# Patient Record
Sex: Female | Born: 2000
Health system: Southern US, Community
[De-identification: ages and names within clinical notes are randomized; demographics above are authoritative.]

## PROBLEM LIST (undated history)

## (undated) DIAGNOSIS — J45909 Unspecified asthma, uncomplicated: Secondary | ICD-10-CM

## (undated) HISTORY — PX: MYRINGOTOMY: SHX2060

---
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
## 2021-02-12 DIAGNOSIS — Z23 Encounter for immunization: Secondary | ICD-10-CM | POA: Diagnosis not present

## 2021-02-12 DIAGNOSIS — Z13220 Encounter for screening for lipoid disorders: Secondary | ICD-10-CM | POA: Diagnosis not present

## 2021-02-12 DIAGNOSIS — Z8349 Family history of other endocrine, nutritional and metabolic diseases: Secondary | ICD-10-CM | POA: Diagnosis not present

## 2021-02-12 DIAGNOSIS — Z0000 Encounter for general adult medical examination without abnormal findings: Secondary | ICD-10-CM | POA: Diagnosis not present

## 2021-02-23 ENCOUNTER — Other Ambulatory Visit (HOSPITAL_COMMUNITY): Payer: Self-pay | Admitting: Oral Surgery

## 2021-02-23 MED FILL — ACETAMINOPHEN/COD #3 TABLET: 300-30 | 5 days supply | Qty: 20 | Fill #0

## 2021-02-23 MED FILL — AMOXICILLIN 500 MG CAPSULE: 500 | 7 days supply | Qty: 21 | Fill #0

## 2021-04-24 ENCOUNTER — Other Ambulatory Visit (HOSPITAL_COMMUNITY): Payer: Self-pay

## 2021-04-24 DIAGNOSIS — J309 Allergic rhinitis, unspecified: Secondary | ICD-10-CM | POA: Diagnosis not present

## 2021-04-24 DIAGNOSIS — H6692 Otitis media, unspecified, left ear: Secondary | ICD-10-CM | POA: Diagnosis not present

## 2021-04-24 DIAGNOSIS — H6121 Impacted cerumen, right ear: Secondary | ICD-10-CM | POA: Diagnosis not present

## 2021-04-24 MED ORDER — FLUTICASONE PROPIONATE 50 MCG/ACT NA SUSP
NASAL | 4 refills | Status: AC
  Filled 2021-04-24: qty 48, 90d supply, fill #0

## 2021-04-24 MED ORDER — AMOXICILLIN 875 MG PO TABS
ORAL_TABLET | ORAL | 0 refills | Status: AC
  Filled 2021-04-24: qty 20, 10d supply, fill #0

## 2021-05-14 DIAGNOSIS — L700 Acne vulgaris: Secondary | ICD-10-CM | POA: Diagnosis not present

## 2021-05-14 DIAGNOSIS — D2261 Melanocytic nevi of right upper limb, including shoulder: Secondary | ICD-10-CM | POA: Diagnosis not present

## 2021-05-22 ENCOUNTER — Other Ambulatory Visit (HOSPITAL_COMMUNITY): Payer: Self-pay

## 2021-05-22 MED ORDER — TRETINOIN 0.025 % EX CREA
TOPICAL_CREAM | CUTANEOUS | 4 refills | Status: AC
  Filled 2021-05-22: qty 45, 30d supply, fill #0

## 2021-07-24 DIAGNOSIS — Z91018 Allergy to other foods: Secondary | ICD-10-CM | POA: Diagnosis not present

## 2021-10-15 ENCOUNTER — Other Ambulatory Visit (HOSPITAL_COMMUNITY): Payer: Self-pay

## 2021-10-15 DIAGNOSIS — H1033 Unspecified acute conjunctivitis, bilateral: Secondary | ICD-10-CM | POA: Diagnosis not present

## 2021-10-15 DIAGNOSIS — J209 Acute bronchitis, unspecified: Secondary | ICD-10-CM | POA: Diagnosis not present

## 2021-10-15 MED ORDER — AZITHROMYCIN 250 MG PO TABS
ORAL_TABLET | ORAL | 0 refills | Status: AC
  Filled 2021-10-15: qty 6, 5d supply, fill #0

## 2021-10-15 MED ORDER — BENZONATATE 200 MG PO CAPS
200.0000 mg | ORAL_CAPSULE | Freq: Three times a day (TID) | ORAL | 0 refills | Status: AC | PRN
  Filled 2021-10-15: qty 21, 7d supply, fill #0

## 2021-10-15 MED ORDER — OFLOXACIN 0.3 % OP SOLN
OPHTHALMIC | 0 refills | Status: AC
  Filled 2021-10-15: qty 5, 10d supply, fill #0

## 2022-11-18 DIAGNOSIS — Z3200 Encounter for pregnancy test, result unknown: Secondary | ICD-10-CM | POA: Diagnosis not present

## 2022-12-13 ENCOUNTER — Other Ambulatory Visit (HOSPITAL_COMMUNITY): Payer: Self-pay

## 2022-12-13 MED ORDER — NORETHIN ACE-ETH ESTRAD-FE 1-20 MG-MCG PO TABS
1.0000 | ORAL_TABLET | Freq: Every day | ORAL | 13 refills | Status: AC
  Filled 2022-12-13: qty 28, 28d supply, fill #0
  Filled 2023-01-14: qty 28, 28d supply, fill #1

## 2022-12-18 ENCOUNTER — Other Ambulatory Visit (HOSPITAL_COMMUNITY): Payer: Self-pay

## 2022-12-18 DIAGNOSIS — L700 Acne vulgaris: Secondary | ICD-10-CM | POA: Diagnosis not present

## 2022-12-18 MED ORDER — TRETINOIN 0.025 % EX CREA
TOPICAL_CREAM | CUTANEOUS | 11 refills | Status: AC
  Filled 2022-12-18: qty 45, 30d supply, fill #0
  Filled 2023-07-27 – 2023-08-16 (×2): qty 45, 30d supply, fill #1

## 2023-02-03 DIAGNOSIS — F419 Anxiety disorder, unspecified: Secondary | ICD-10-CM | POA: Diagnosis not present

## 2023-02-12 DIAGNOSIS — F419 Anxiety disorder, unspecified: Secondary | ICD-10-CM | POA: Diagnosis not present

## 2023-02-19 DIAGNOSIS — F419 Anxiety disorder, unspecified: Secondary | ICD-10-CM | POA: Diagnosis not present

## 2023-02-26 DIAGNOSIS — F419 Anxiety disorder, unspecified: Secondary | ICD-10-CM | POA: Diagnosis not present

## 2023-05-22 DIAGNOSIS — N882 Stricture and stenosis of cervix uteri: Secondary | ICD-10-CM | POA: Diagnosis not present

## 2023-05-22 DIAGNOSIS — Z30430 Encounter for insertion of intrauterine contraceptive device: Secondary | ICD-10-CM | POA: Diagnosis not present

## 2023-05-22 DIAGNOSIS — Z3202 Encounter for pregnancy test, result negative: Secondary | ICD-10-CM | POA: Diagnosis not present

## 2023-07-28 ENCOUNTER — Other Ambulatory Visit (HOSPITAL_COMMUNITY): Payer: Self-pay

## 2023-08-05 ENCOUNTER — Other Ambulatory Visit (HOSPITAL_COMMUNITY): Payer: Self-pay

## 2023-08-16 ENCOUNTER — Other Ambulatory Visit (HOSPITAL_COMMUNITY): Payer: Self-pay

## 2024-01-07 DIAGNOSIS — J069 Acute upper respiratory infection, unspecified: Secondary | ICD-10-CM | POA: Diagnosis not present

## 2024-01-07 DIAGNOSIS — J101 Influenza due to other identified influenza virus with other respiratory manifestations: Secondary | ICD-10-CM | POA: Diagnosis not present

## 2024-01-07 DIAGNOSIS — R051 Acute cough: Secondary | ICD-10-CM | POA: Diagnosis not present

## 2024-01-07 DIAGNOSIS — Z1330 Encounter for screening examination for mental health and behavioral disorders, unspecified: Secondary | ICD-10-CM | POA: Diagnosis not present

## 2024-08-03 DIAGNOSIS — Z124 Encounter for screening for malignant neoplasm of cervix: Secondary | ICD-10-CM | POA: Diagnosis not present

## 2024-08-03 DIAGNOSIS — Z113 Encounter for screening for infections with a predominantly sexual mode of transmission: Secondary | ICD-10-CM | POA: Diagnosis not present

## 2024-08-03 DIAGNOSIS — Z01411 Encounter for gynecological examination (general) (routine) with abnormal findings: Secondary | ICD-10-CM | POA: Diagnosis not present

## 2024-08-03 DIAGNOSIS — Z118 Encounter for screening for other infectious and parasitic diseases: Secondary | ICD-10-CM | POA: Diagnosis not present

## 2024-08-03 DIAGNOSIS — Z01419 Encounter for gynecological examination (general) (routine) without abnormal findings: Secondary | ICD-10-CM | POA: Diagnosis not present

## 2024-08-09 DIAGNOSIS — T781XXA Other adverse food reactions, not elsewhere classified, initial encounter: Secondary | ICD-10-CM | POA: Diagnosis not present

## 2024-08-09 DIAGNOSIS — T7840XA Allergy, unspecified, initial encounter: Secondary | ICD-10-CM | POA: Diagnosis not present

## 2024-11-10 DIAGNOSIS — R051 Acute cough: Secondary | ICD-10-CM | POA: Diagnosis not present

## 2024-11-10 DIAGNOSIS — Z1330 Encounter for screening examination for mental health and behavioral disorders, unspecified: Secondary | ICD-10-CM | POA: Diagnosis not present

## 2024-11-11 DIAGNOSIS — R051 Acute cough: Secondary | ICD-10-CM | POA: Diagnosis not present
